# Patient Record
Sex: Female | Born: 1965 | Race: White | Hispanic: Yes | Marital: Married
Health system: Southern US, Community
[De-identification: ages and names within clinical notes are randomized; demographics above are authoritative.]

## PROBLEM LIST (undated history)

## (undated) DIAGNOSIS — E785 Hyperlipidemia, unspecified: Secondary | ICD-10-CM

## (undated) HISTORY — DX: Hyperlipidemia, unspecified: E78.5

---

## 2001-11-13 ENCOUNTER — Other Ambulatory Visit: Admission: RE | Admit: 2001-11-13 | Discharge: 2001-11-13 | Payer: Self-pay | Admitting: Obstetrics and Gynecology

## 2003-07-16 ENCOUNTER — Ambulatory Visit (HOSPITAL_COMMUNITY): Admission: RE | Admit: 2003-07-16 | Discharge: 2003-07-16 | Payer: Self-pay | Admitting: Obstetrics and Gynecology

## 2003-09-08 ENCOUNTER — Ambulatory Visit (HOSPITAL_COMMUNITY): Admission: RE | Admit: 2003-09-08 | Discharge: 2003-09-08 | Payer: Self-pay | Admitting: Obstetrics and Gynecology

## 2003-10-20 ENCOUNTER — Ambulatory Visit (HOSPITAL_COMMUNITY): Admission: RE | Admit: 2003-10-20 | Discharge: 2003-10-20 | Payer: Self-pay | Admitting: Obstetrics and Gynecology

## 2003-10-30 ENCOUNTER — Other Ambulatory Visit: Admission: RE | Admit: 2003-10-30 | Discharge: 2003-10-30 | Payer: Self-pay | Admitting: Obstetrics and Gynecology

## 2003-12-04 ENCOUNTER — Ambulatory Visit: Admission: RE | Admit: 2003-12-04 | Discharge: 2003-12-04 | Payer: Self-pay | Admitting: Gynecology

## 2003-12-28 ENCOUNTER — Ambulatory Visit (HOSPITAL_COMMUNITY): Admission: RE | Admit: 2003-12-28 | Discharge: 2003-12-28 | Payer: Self-pay | Admitting: Obstetrics and Gynecology

## 2004-10-07 ENCOUNTER — Other Ambulatory Visit: Admission: RE | Admit: 2004-10-07 | Discharge: 2004-10-07 | Payer: Self-pay | Admitting: Obstetrics and Gynecology

## 2004-10-14 ENCOUNTER — Ambulatory Visit (HOSPITAL_COMMUNITY): Admission: RE | Admit: 2004-10-14 | Discharge: 2004-10-14 | Payer: Self-pay | Admitting: Obstetrics and Gynecology

## 2006-05-16 ENCOUNTER — Ambulatory Visit: Payer: Self-pay | Admitting: Family Medicine

## 2006-05-18 ENCOUNTER — Ambulatory Visit: Payer: Self-pay | Admitting: *Deleted

## 2006-05-18 ENCOUNTER — Ambulatory Visit: Payer: Self-pay | Admitting: Family Medicine

## 2006-08-09 ENCOUNTER — Ambulatory Visit: Payer: Self-pay | Admitting: Internal Medicine

## 2006-08-21 ENCOUNTER — Ambulatory Visit: Payer: Self-pay | Admitting: Internal Medicine

## 2006-08-30 ENCOUNTER — Ambulatory Visit: Payer: Self-pay | Admitting: Internal Medicine

## 2006-11-03 IMAGING — CT CT PELVIS W/ CM
1 of 2 series · 14 of 32 positions shown, 19 images · IV contrast ([ID] READICAT & [ID] OMNIP 300%)
Comparison: none

CLINICAL DATA: Persistent pelvic pain, left greater than right.  The patient has a history of endometriosis and had a complete hysterectomy six months ago.
 PELVIS CT WITH CONTRAST:
TECHNIQUE: Multidetector CT imaging of the pelvis was performed following the standard protocol during bolus administration of intravenous contrast.
 Contrast:  100 cc Omnipaque 300.
 The scan demonstrates a multiseptated 4 cm lesion in the right side of the pelvis just to the right of the sigmoid colon and immediately adjacent to the appendix and the distal right ureter.  In addition, there is a second low density mass measuring 17 mm in diameter to the right of the sigmoid portion of the colon at approximately the same level.  These appear to be primarily cystic lesions.  The terminal ileum and ileocecal valve region appear normal.  There is no discrete abnormality of the bladder or distal ureters.  The lesion on the left is also immediately adjacent to the left ureter.  Given the patient?s history, I suspect these may represent small foci of residual ovarian tissue.  There is no free fluid.  The bladder appears normal.  No adenopathy or other significant abnormality.

[Series 2: abd pelvis · axial · 0.63mm/px · z∈[-420,-254]mm · 14 of 37 slices shown, 19 images]
[im 2/37  soft-tissue]
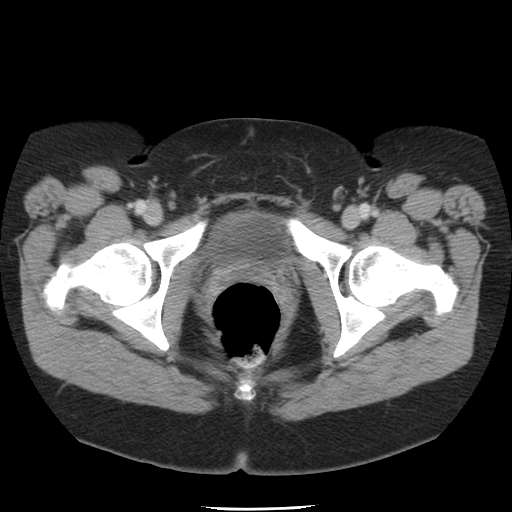
[im 2/37  bone]
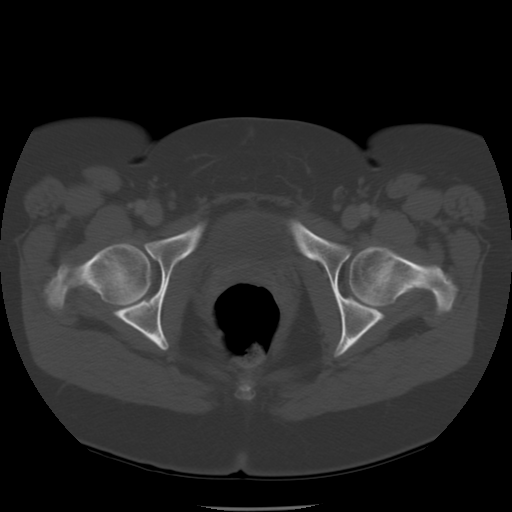
[im 6/37  soft-tissue]
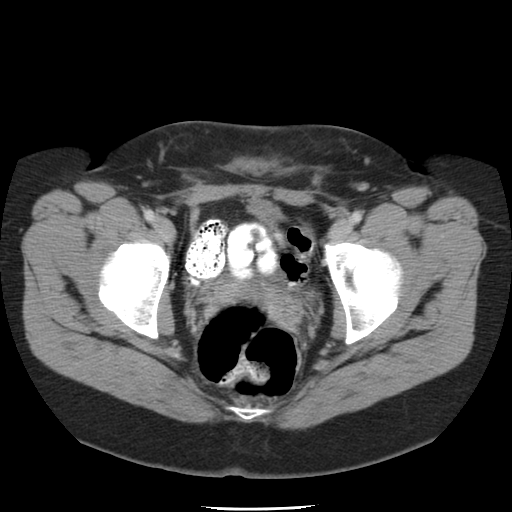
[im 8/37  soft-tissue]
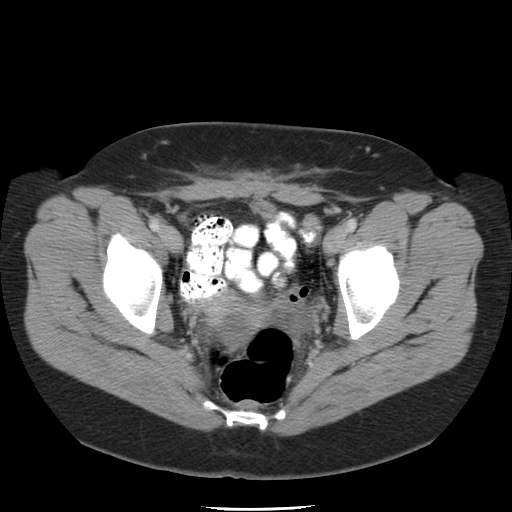
[im 11/37  soft-tissue]
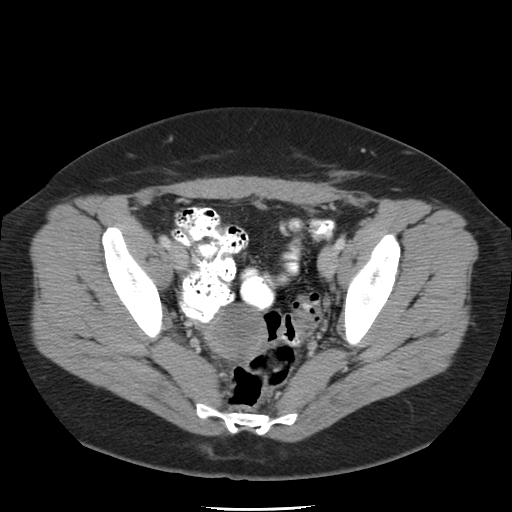
[im 13/37  soft-tissue]
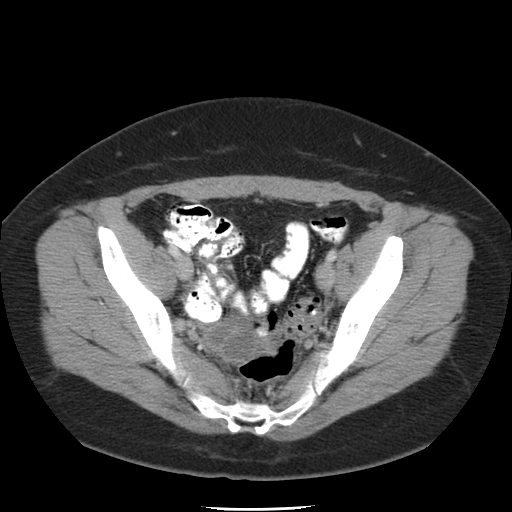
[im 17/37  soft-tissue]
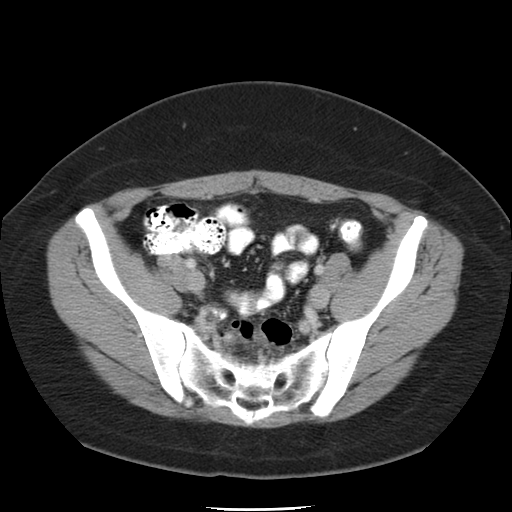
[im 19/37  soft-tissue]
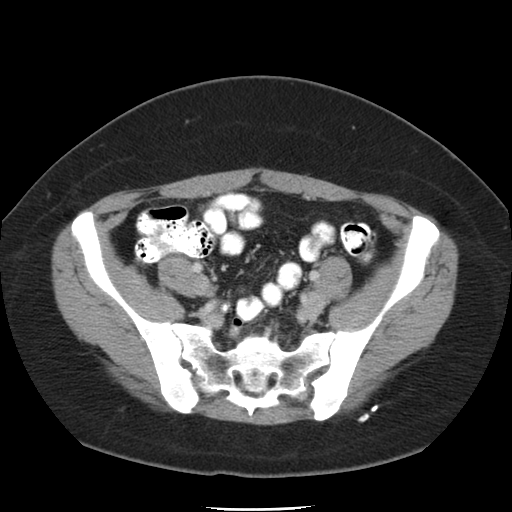
[im 20/37  soft-tissue]
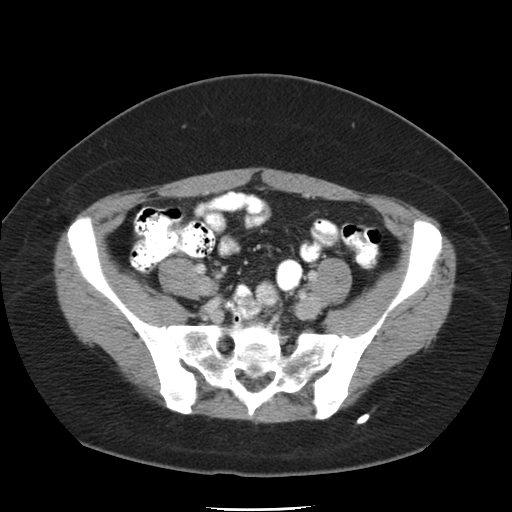
[im 24/37  soft-tissue]
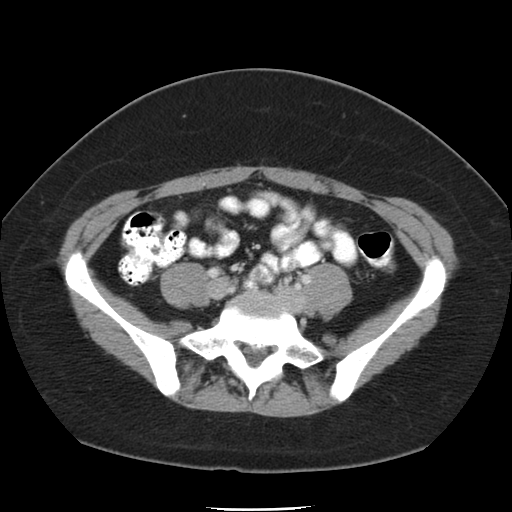
[im 24/37  bone]
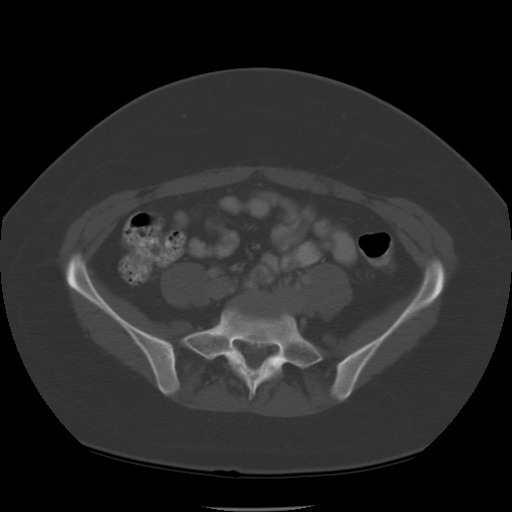
[im 26/37  soft-tissue]
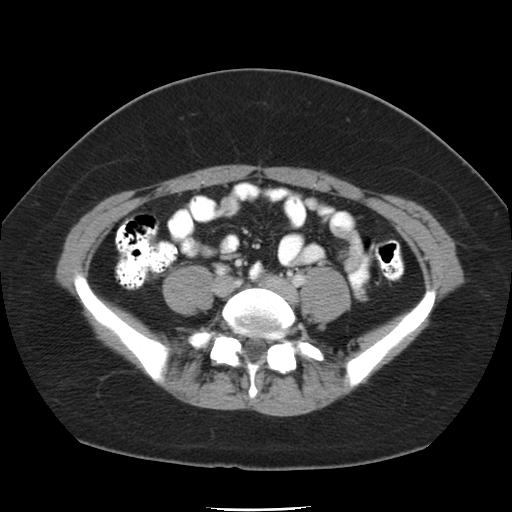
[im 29/37  soft-tissue]
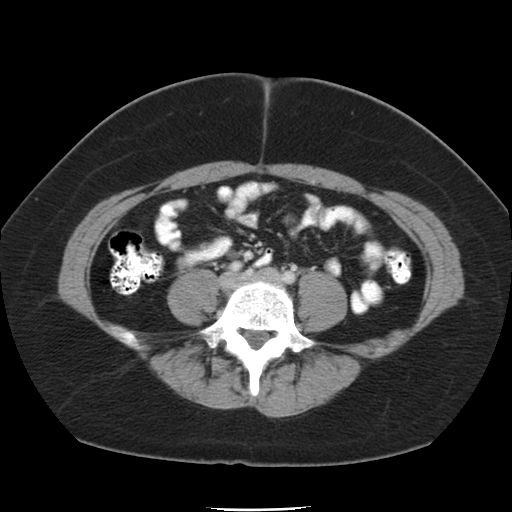
[im 29/37  lung]
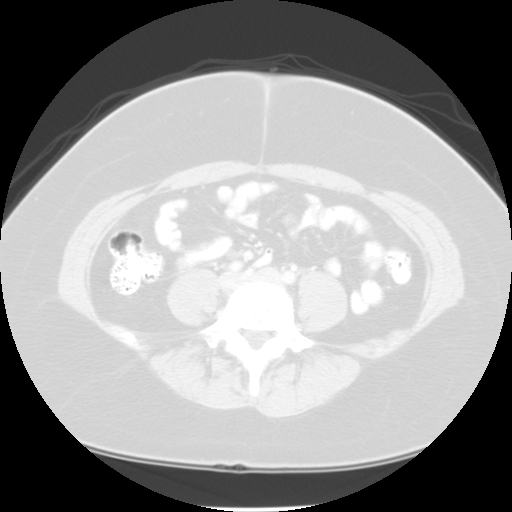
[im 31/37  soft-tissue]
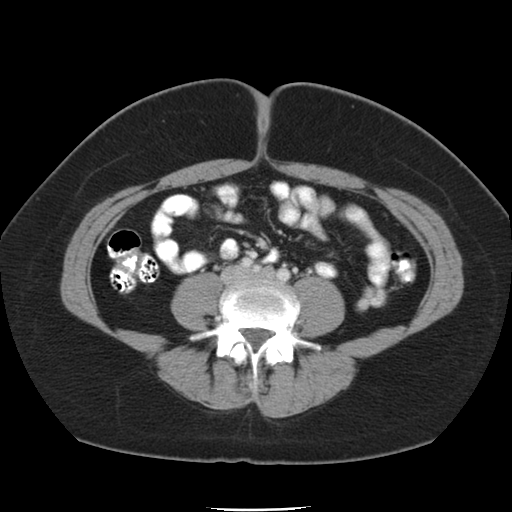
[im 31/37  lung]
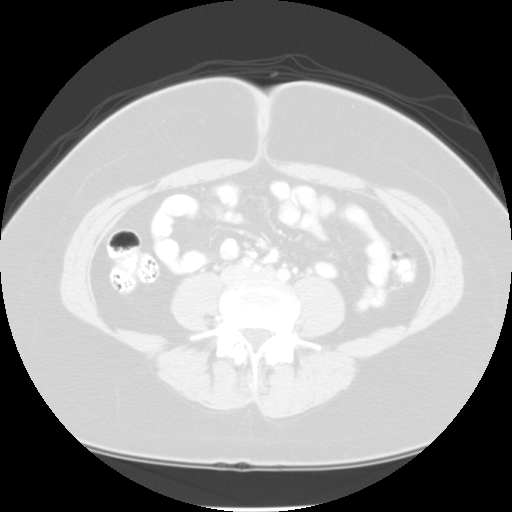
[im 33/37  lung]
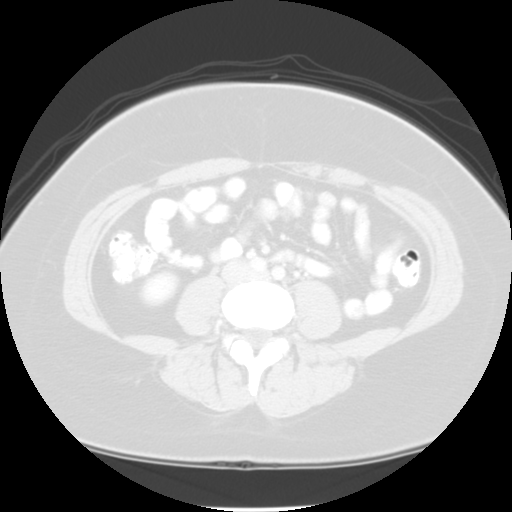
[im 35/37  soft-tissue]
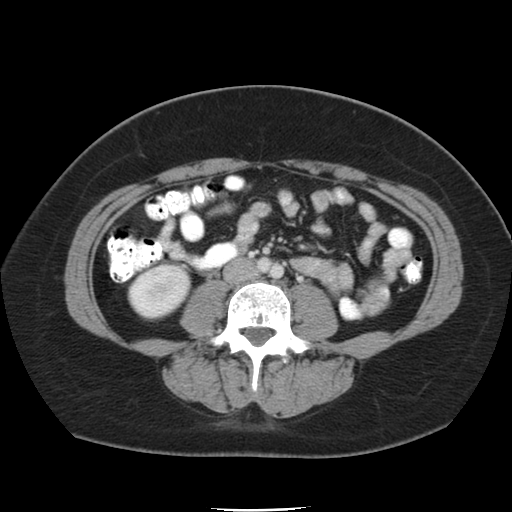
[im 35/37  lung]
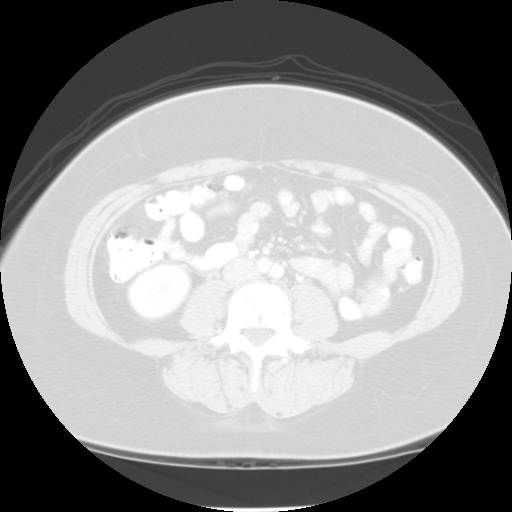

[14 of 32 positions shown; findings below may reference images not displayed]

IMPRESSION: Two cystic pelvic masses are immediately adjacent to the sigmoid portion of the colon and the distal ureters.  The one on the right measures 4 cm in diameter, and the smaller lesion on the left measures 1.7 cm in diameter.  The lesion on the right is immediately adjacent to the appendix and right ureter and sigmoid colon.  These probably represent residual ovarian tissue with cysts.

## 2006-12-05 ENCOUNTER — Encounter (INDEPENDENT_AMBULATORY_CARE_PROVIDER_SITE_OTHER): Payer: Self-pay | Admitting: *Deleted

## 2006-12-12 ENCOUNTER — Emergency Department (HOSPITAL_COMMUNITY): Admission: EM | Admit: 2006-12-12 | Discharge: 2006-12-13 | Payer: Self-pay | Admitting: Emergency Medicine

## 2006-12-14 ENCOUNTER — Ambulatory Visit: Payer: Self-pay | Admitting: Family Medicine

## 2006-12-17 ENCOUNTER — Ambulatory Visit: Payer: Self-pay | Admitting: Internal Medicine

## 2007-01-14 ENCOUNTER — Ambulatory Visit: Payer: Self-pay | Admitting: Family Medicine

## 2007-01-22 ENCOUNTER — Ambulatory Visit: Payer: Self-pay | Admitting: Internal Medicine

## 2010-04-10 ENCOUNTER — Encounter: Payer: Self-pay | Admitting: Obstetrics and Gynecology

## 2010-08-05 NOTE — Consult Note (Signed)
NAME:  Hannah Hoover, Hannah Hoover                    ACCOUNT NO.:  [id number]   MEDICAL RECORD NO.:  [id number]               PATIENT TYPE:  OUT   LOCATION:  GYN                          FACILITY:  [facility]   PHYSICIAN:  De Blanch, M.D.DATE OF BIRTH:  Sep 03, 1965   DATE OF CONSULTATION:  12/04/2003  DATE OF DISCHARGE:  12/04/2003                                   CONSULTATION   CONTINUATION OF PREVIOUS DICTATION:   DRUG ALLERGIES:  The patient has no allergies, although when she took  Lupron, she had significant depression.   PAST SURGICAL HISTORY:  Laparoscopic left salpingo-oophorectomy.   CURRENT MEDICATIONS:  Hydrocodone p.r.n.   REVIEW OF SYSTEMS:  Negative except for the pain noted above in the left  lower quadrant.  The patient has regular cyclic menstrual periods, although  she had a heavy period taking Seasonale recently.   PHYSICAL EXAMINATION:  VITAL SIGNS:  Height 5 feet 3 inches.  Weight 154  pounds, blood pressure 112/80, pulse 80.  GENERAL:  The patient is a pleasant Hispanic female in no acute distress.  HEENT:  Negative.  NECK:  Supple without thyromegaly.  There is no supraclavicular or inguinal  adenopathy.  ABDOMEN:  Soft and nontender, although she points to the left lower quadrant  as her source of pain.  She has a well-healed subumbilical scar.  She also  has two unusual scars in the mons bilaterally which apparently are from  laparoscopic procedure.  PELVIC:  EG//BUS, vagina, bladder, urethra are normal.  The patient is  having her menstrual period.  Cervix appears normal.  The uterus is  anterior, slightly irregular, approximately [redacted] weeks gestational size.  I do  not feel any adnexal masses.  Rectovaginal exam confirms.  I do not feel any  cul-de-sac nodularity.   IMPRESSION:  History of stage IV endometriosis, status post laparoscopic  left salpingo-oophorectomy.  The patient has recently had an apparent  functional ovarian cyst on the right ovary.  Her biggest  problem is that of  left lower quadrant pain of questionable etiology.   PLAN:  I would like to get copies of Dr. Mariella Saa operative note to get a  better understanding of the patient's surgical findings as well as a copy of  the CT scan apparently performed in March of this past year.  I am  particularly interested to be certain that the ureter is not deviated or  obstructed and as to whether there is any persistent mass on the left  pelvis.  The patient is scheduled to undergo exploratory laparotomy through  a Pfannenstiel incision in October in conjunction with Dr. Edward Jolly.  The  patient indicates that if she has endometriosis on her right ovary, she  would like to go ahead and have a total abdominal hysterectomy and right  salpingo-oophorectomy.  On the other hand, if we can explain the source of  her left lower quadrant pain without removing her uterus and other tube and  ovary, she would prefer that, and she still wishes to consider in vitro  fertilization which  she would do in her home country of [place] in December  2005.  I indicated to the patient and her husband that we would do  everything we could to be conservative, and that I am uncertain as to the  etiology of her pain, further indicated that this surgical  procedure would necessarily resolve her pain, and we could not give her any  guarantees as to outcome.  All of their questions were answered.  We will  review pertinent medical records once they arrive and make any further plans  thereafter.     Dani   DC/MEDQ  D:  12/04/2003  T:  12/06/2003  Job:  [id number]   cc:   Randye Lobo, M.D.  [address], [address]  [place]  [place] [address]  Fax: [phone]   Telford Nab, R.N.  [address]  [place], [place] [address]

## 2010-08-05 NOTE — Consult Note (Signed)
Hannah Hoover, Hannah Hoover NO.:  [id number]   MEDICAL RECORD NO.:  [id number]          PATIENT TYPE:  OUT   LOCATION:  GYN                          FACILITY:  [facility]   PHYSICIAN:  De Blanch, M.D.DATE OF BIRTH:  [birth year]   DATE OF CONSULTATION:  12/04/2003  DATE OF DISCHARGE:  12/04/2003                                   CONSULTATION   A 45 year old white female seen in consultation at the request of Dr. Conley Simmonds, regarding management of stage IV endometriosis.  The patient  currently has significant left lower quadrant pain which extends out into  her left hip and anterior thigh.  This has been worsening since laparoscopic  salpingo-oophorectomy performed in January 2005.  The patient had previously  been desirous of preserving fertility but with this worsening pain, which  requires the use of several doses of hydrocodone daily, the patient is now  acquiesced to proceed with definitive surgical therapy with TAH/BSO and  further evaluation of the left-sided pain.   HISTORY OF PRESENT ILLNESS:  The patient apparently has had a several year  history of endometriosis and has been infertile.  She underwent surgery on  April 10, 2003, by Dr. Ruby Cola, in [place].  Apparently, at that  time, a left salpingo-oophorectomy was performed.  She was told her right  ovary appeared normal.  The patient has no uterine fibroids.  More recently,  the patient has been followed by Dr. Conley Simmonds, and an 8 cm right ovarian  cyst was discovered, although on follow up CT scan, these had decreased in  size to 4 cm.  Throughout all of this, the patient has remained having left-  sided pelvic pain.   PAST MEDICAL HISTORY/MEDICAL ILLNESSES:  None.   DRUG ALLERGIES:  The patient has no allergies, although when she took  Lupron, she had significant depression.   PAST SURGICAL HISTORY:  Laparoscopic left salpingo-oophorectomy.   CURRENT MEDICATIONS:  Hydrocodone  p.r.n.   REVIEW OF SYSTEMS:  Negative except for the pain noted above in the left  lower quadrant.  The patient has regular cyclic menstrual periods, although  she had a heavy period taking Seasonale recently.   PHYSICAL EXAMINATION:  VITAL SIGNS:  Height 5 feet 3 inches.  Weight 154  pounds, blood pressure 112/80, pulse 80.  GENERAL:  The patient is a pleasant Hispanic female in no acute distress.  HEENT:  Negative.  NECK:  Supple without thyromegaly.  There is no supraclavicular or inguinal  adenopathy.  ABDOMEN:  Soft and nontender, although she points to the left lower quadrant  as her source of pain.  She has a well-healed subumbilical scar.  She also  has two unusual scars in the mons bilaterally which apparently are from  laparoscopic procedure.  PELVIC:  EG//BUS, vagina, bladder, urethra are normal.  The patient is  having her menstrual period.  Cervix appears normal.  The uterus is  anterior, slightly irregular,approximately [redacted] weeks gestational size.  I do  not feel any adnexal masses.  Rectovaginal exam confirms.  I do not feel any  cul-de-sac  nodularity.   IMPRESSION:  History of stage IV endometriosis, status post laparoscopic  left salpingo-oophorectomy.  The patient has recently had an apparent  functional ovarian cyst on the right ovary.  Her biggest problem is that of  left lower quadrant pain of questionable etiology.   PLAN:  I would like to get copies of Dr. Mariella Saa operative note to get a  better understanding of the patient's surgical findings as well as a copy of  the CT scan apparently performed in March of this past year.  I am  particularly interested to be certain that the ureter is not deviated or  obstructed and as to whether there is any persistent mass on the left  pelvis.  The patient is scheduled to undergo exploratory laparotomy through  a Pfannenstiel incision in October in conjunction with Dr. Edward Jolly.  The  patient indicates that if she has  endometriosis on her right ovary, she  would like to go ahead and have a total abdominal hysterectomy and right  salpingo-oophorectomy.  On the other hand, if we can explain the source of  her left lower quadrant pain without removing her uterus and other tube and  ovary, she would prefer that, and she still wishes to consider in vitro  fertilization which she would do in her home country of [place] in December  2005.  I indicated to the patient and her husband that we would do  everything we could to be conservative, and that I am uncertain as to the  etiology of her pain, further indicated that this surgical procedure would  necessarily resolve her pain, and we could not give her any guarantees as to  outcome.  All of their questions were answered.  We will review pertinent  medical records once they arrive and make any further plans thereafter.     Dani   DC/MEDQ  D:  12/04/2003  T:  12/06/2003  Job:  [id number]

## 2012-05-30 ENCOUNTER — Other Ambulatory Visit: Payer: Self-pay | Admitting: Family Medicine

## 2012-05-30 DIAGNOSIS — Z1231 Encounter for screening mammogram for malignant neoplasm of breast: Secondary | ICD-10-CM

## 2012-07-05 ENCOUNTER — Ambulatory Visit: Payer: Self-pay

## 2014-12-15 LAB — POCT GLUCOSE (DEVICE FOR HOME USE): Glucose Fasting, POC: 86 mg/dL (ref 70–99)

## 2015-01-14 ENCOUNTER — Encounter: Payer: Self-pay | Admitting: Dietician

## 2015-01-14 ENCOUNTER — Encounter: Payer: BLUE CROSS/BLUE SHIELD | Attending: Family Medicine | Admitting: Dietician

## 2015-01-14 VITALS — Ht 63.0 in | Wt 165.0 lb

## 2015-01-14 DIAGNOSIS — Z713 Dietary counseling and surveillance: Secondary | ICD-10-CM | POA: Diagnosis not present

## 2015-01-14 DIAGNOSIS — R635 Abnormal weight gain: Secondary | ICD-10-CM | POA: Diagnosis present

## 2015-01-14 NOTE — Patient Instructions (Signed)
-  Continue morning snack of fruit and almonds -Try using smaller plates, bowls, etc. -Work on eating slowly; wait a few minutes before having seconds -Work on drinking more water (try sparkling water) -Keep a variety of snacks available at work (fruit, almonds, yogurt, string cheese, boiled eggs)  -Pre portion! -Choose lean proteins: seafood, chicken, lean cuts of beef, 93/7 ground beef/turkey

## 2015-01-14 NOTE — Progress Notes (Signed)
  Medical Nutrition Therapy:  Appt start time: 1120 end time:  1220.   Assessment:  Primary concerns today: Hannah Hoover is here today with her husband to discuss her weight. She states that she used to be much smaller and noticed an increase in weight when she had a hysterectomy. Currently taking Tibolone (hormone therapy). She works as an Airline pilotaccountant. She works from 7am-5pm with a 1-hour lunch. Recently started her current job 4 months ago; she previously spent most of her days traveling and has noticed that she enjoys being in an office where she can have more structured meals and snacks. Her company provides catered lunch for employees 4 days a week. Hannah Hoover recently took Simvastatin for cholesterol and is currently not taking as her numbers improved.   Preferred Learning Style:   No preference indicated   Learning Readiness:   Ready   MEDICATIONS: see list   DIETARY INTAKE:  24-hr recall:   Wakes up at 6:30  B ( AM): 2 cups coffee with 2% milk and 1/2 tsp sugar or Stevia with wheat toast or packet of oatmeal or eggs and bread and sausage on weekends   Snk (10-11 AM): banana and almonds  L ( PM): lunch provided at work: egg/tuna salad sandwich, pasta, pizza, Salsaritas with diet or regular soda Snk ( PM): chocolate 3x a week, coffee  D ( PM): pasta and meatballs Sometimes naps before workout Snk ( PM): fruit  Bedtime at 1am  Beverages: wine occasionally, diet and regular soda, coffee, and water  Usual physical activity: yoga or other workout class 3-4x a week  Estimated energy needs: 1600-1800 calories  Progress Towards Goal(s):  In progress.   Nutritional Diagnosis:  Arnold Line-3.4 Unintentional weight gain As related to hysterectomy, excessive energy intake, history of erratic meal pattern, and inappropriate food/beverage choices.  As evidenced by patient reports recent significant weight gain.    Intervention:  Nutrition counseling provided. Goals: -Continue morning snack of fruit  and almonds -Try using smaller plates, bowls, etc. -Work on eating slowly; wait a few minutes before having seconds -Work on drinking more water (try sparkling water) -Keep a variety of snacks available at work (fruit, almonds, yogurt, string cheese, boiled eggs)  -Pre portion! -Choose lean proteins: seafood, chicken, lean cuts of beef, 93/7 ground beef/turkey  Teaching Method Utilized:  Visual Auditory  Handouts given during visit include:  Low sodium flavoring tips  MyPlate  Diabetes sample meal plan   Barriers to learning/adherence to lifestyle change: none  Demonstrated degree of understanding via:  Teach Back   Monitoring/Evaluation:  Dietary intake, exercise, and body weight in 6 week(s).

## 2015-02-22 ENCOUNTER — Ambulatory Visit: Payer: BLUE CROSS/BLUE SHIELD | Admitting: Dietician

## 2015-03-25 ENCOUNTER — Ambulatory Visit: Payer: Self-pay | Admitting: Gynecology

## 2016-08-02 ENCOUNTER — Encounter: Payer: Self-pay | Admitting: Gynecology
